# Patient Record
Sex: Female | Born: 1990 | Race: Black or African American | Hispanic: No | Marital: Single | Smoking: Never smoker
Health system: Southern US, Community
[De-identification: ages and names within clinical notes are randomized; demographics above are authoritative.]

## PROBLEM LIST (undated history)

## (undated) DIAGNOSIS — Z789 Other specified health status: Secondary | ICD-10-CM

## (undated) HISTORY — PX: NO PAST SURGERIES: SHX2092

## (undated) SURGERY — Surgical Case
Anesthesia: *Unknown

---
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
## 2015-10-31 ENCOUNTER — Other Ambulatory Visit (HOSPITAL_COMMUNITY): Payer: Self-pay | Admitting: Obstetrics and Gynecology

## 2015-10-31 DIAGNOSIS — IMO0002 Reserved for concepts with insufficient information to code with codable children: Secondary | ICD-10-CM

## 2015-10-31 DIAGNOSIS — Z3689 Encounter for other specified antenatal screening: Secondary | ICD-10-CM

## 2015-10-31 DIAGNOSIS — O351XX0 Maternal care for (suspected) chromosomal abnormality in fetus, not applicable or unspecified: Secondary | ICD-10-CM

## 2015-10-31 DIAGNOSIS — Z3A18 18 weeks gestation of pregnancy: Secondary | ICD-10-CM

## 2015-11-02 ENCOUNTER — Encounter (HOSPITAL_COMMUNITY): Payer: Self-pay | Admitting: Obstetrics and Gynecology

## 2015-11-10 ENCOUNTER — Encounter (HOSPITAL_COMMUNITY): Payer: Self-pay | Admitting: *Deleted

## 2015-11-11 ENCOUNTER — Ambulatory Visit (HOSPITAL_COMMUNITY)
Admission: RE | Admit: 2015-11-11 | Discharge: 2015-11-11 | Disposition: A | Discharge status: Home / Self Care | Payer: 59 | Source: Ambulatory Visit | Attending: Obstetrics and Gynecology | Admitting: Obstetrics and Gynecology

## 2015-11-11 ENCOUNTER — Encounter (HOSPITAL_COMMUNITY): Payer: Self-pay

## 2015-11-11 ENCOUNTER — Other Ambulatory Visit (HOSPITAL_COMMUNITY): Payer: Self-pay | Admitting: Obstetrics and Gynecology

## 2015-11-11 ENCOUNTER — Other Ambulatory Visit (HOSPITAL_COMMUNITY): Payer: Self-pay | Admitting: *Deleted

## 2015-11-11 DIAGNOSIS — O351XX0 Maternal care for (suspected) chromosomal abnormality in fetus, not applicable or unspecified: Secondary | ICD-10-CM

## 2015-11-11 DIAGNOSIS — IMO0002 Reserved for concepts with insufficient information to code with codable children: Secondary | ICD-10-CM

## 2015-11-11 DIAGNOSIS — Z3A15 15 weeks gestation of pregnancy: Secondary | ICD-10-CM | POA: Insufficient documentation

## 2015-11-11 DIAGNOSIS — O289 Unspecified abnormal findings on antenatal screening of mother: Secondary | ICD-10-CM

## 2015-11-11 DIAGNOSIS — Z3689 Encounter for other specified antenatal screening: Secondary | ICD-10-CM

## 2015-11-11 DIAGNOSIS — O280 Abnormal hematological finding on antenatal screening of mother: Secondary | ICD-10-CM | POA: Insufficient documentation

## 2015-11-11 DIAGNOSIS — Z363 Encounter for antenatal screening for malformations: Secondary | ICD-10-CM | POA: Insufficient documentation

## 2015-11-11 DIAGNOSIS — Z315 Encounter for genetic counseling: Secondary | ICD-10-CM | POA: Diagnosis present

## 2015-11-11 DIAGNOSIS — O283 Abnormal ultrasonic finding on antenatal screening of mother: Secondary | ICD-10-CM | POA: Insufficient documentation

## 2015-11-11 DIAGNOSIS — Z3A18 18 weeks gestation of pregnancy: Secondary | ICD-10-CM

## 2015-11-11 HISTORY — DX: Other specified health status: Z78.9

## 2015-11-11 NOTE — Progress Notes (Signed)
Genetic Counseling  High-Risk Gestation Note  Appointment Date:  11/11/2015 Referred By: Annette AlandAnderson, Mark E, MD Date of Birth:  12/22/1990   Pregnancy History: G1P0 Estimated Date of Delivery: 04/28/16 Estimated Gestational Age: [id number] Attending: Charlsie MerlesMark Newman, MD   [name] was seen for genetic counseling because of an increased risk for fetal Down syndrome based on first trimester screening. The patient's mother was also present for today's visit.   In summary:  Reviewed results of first trimester screening test  Increased risk for Down syndrome (1 in 263)  Discussed additional screening options  NIPS-declined today  Ultrasound-limited ultrasound performed today; see separate report  Follow-up ultrasound scheduled 12/23/15  Discussed diagnostic testing options  Amniocentesis-declined  Reviewed family history concerns  Discussed general population carrier screening options - patient declined  CF  SMA  Hemoglobinopathies  She was counseled regarding the screening result and the associated 1 in 263 risk for fetal Down syndrome.  We reviewed chromosomes, nondisjunction, and the common features and variable prognosis of Down syndrome.  In addition, we reviewed the screen adjusted reduction in risk for trisomy 18 (<1 in 5,000).  We also discussed other explanations for a screen positive result including: differences in maternal metabolism and normal variation.  We reviewed other available screening options including noninvasive prenatal screening (NIPS)/cell free DNA (cfDNA) screening and detailed ultrasound.  She was counseled that screening tests are used to modify a patient's a priori risk for aneuploidy, typically based on age. This estimate provides a pregnancy specific risk assessment. We reviewed the benefits and limitations of each option. Specifically, we discussed the conditions for which each test screens, the detection rates, and false positive rates of each. She  was also counseled regarding diagnostic testing via amniocentesis. We reviewed the approximate 1 in 300-500 risk for complications from amniocentesis, including spontaneous pregnancy loss. We discussed the possible results that the tests might provide including: positive, negative, unanticipated, and no result. Finally, they were counseled regarding the cost of each option and potential out of pocket expenses. After consideration of all the options, she declined NIPS and amniocentesis at this time, stating she was comfortable with the current risk assessment. [name] also indicated that she would not alter the pregnancy course in the case of the presence of Down syndrome in the pregnancy.   An ultrasound was performed today. The ultrasound report will be sent under separate cover. There were no visualized fetal anomalies or markers suggestive of aneuploidy; however, some views of fetal anatomy were limited due to early gestation. She understands that screening tests cannot rule out all birth defects or genetic syndromes. The patient was advised of this limitation and states she still does not want additional testing at this time. Follow-up ultrasound is scheduled for 12/23/15.   [name] was provided with written information regarding cystic fibrosis (CF), spinal muscular atrophy (SMA) and hemoglobinopathies including the carrier frequency, availability of carrier screening and prenatal diagnosis if indicated.  In addition, we discussed that CF and hemoglobinopathies are routinely screened for as part of the [place] newborn screening panel.  She declined screening for CF, SMA and hemoglobinopathies.   Both family histories were reviewed and found to be contributory for the patient's maternal great-uncle with spina bifida. His mother reportedly had mumps during her pregnancy with him. No additional relatives were reported with spina bifida or other birth defects. We discussed that spina bifida is a common birth  differences and affects approximately 1 in 500 live births.  They  were counseled that neural tube defects (NTDs) occurs as an isolated finding, in the majority of cases. Isolated NTDs are usually inherited in a multifactorial manner.  Multifactorial conditions have both environmental and genetic factors that contribute to their development.  Both the genetic and environmental factors that contribute to the development of spina bifida are largely unknown; however, some medications and health conditions, such as uncontrolled diabetes and obesity, may increase the chance of spina bifida.  We also discussed the role of folic acid in the development of the neural tube and prevention of spina bifida.  Approximately 5-10% of individuals who have spina bifida also have an underlying chromosome condition.  If this relative had a nonsyndromic, multifactorial NTD, the risk of recurrence would not be increased above the general population risk for the current pregnancy.  If however, the child had an underlying genetic syndrome, the risk of recurrence depends upon the inheritance of the condition.    Further genetic counseling is warranted if more information is obtained.  [name] was counseled regarding prenatal screening and diagnostic options for detection of NTDs.  We discussed that screening options adjust a patient's a priori risk and provide a pregnancy specific risk for the condition.  Specifically, we reviewed MSAFP in the second trimester and ultrasound.  We reviewed that detailed ultrasound detects between 80 and 90% of NTDs.  Without further information regarding the provided family history, an accurate genetic risk cannot be calculated. Further genetic counseling is warranted if more information is obtained.  [name] denied exposure to environmental toxins or chemical agents. She denied the use of alcohol, tobacco or street drugs. She denied significant viral illnesses during the course of  her pregnancy. Her medical and surgical histories were noncontributory.   I counseled [name] for approximately 40 minutes regarding the above risks and available options.   Quinn PlowmanKaren Jazlynn Nemetz, MS,  Certified Genetic Counselor 11/11/2015

## 2015-11-17 ENCOUNTER — Other Ambulatory Visit (HOSPITAL_COMMUNITY): Payer: Self-pay

## 2015-12-23 ENCOUNTER — Encounter (HOSPITAL_COMMUNITY): Payer: Self-pay

## 2015-12-23 ENCOUNTER — Ambulatory Visit (HOSPITAL_COMMUNITY)
Admission: RE | Admit: 2015-12-23 | Discharge: 2015-12-23 | Disposition: A | Discharge status: Home / Self Care | Payer: 59 | Source: Ambulatory Visit | Attending: Obstetrics and Gynecology | Admitting: Obstetrics and Gynecology

## 2015-12-23 ENCOUNTER — Other Ambulatory Visit (HOSPITAL_COMMUNITY): Payer: Self-pay | Admitting: Obstetrics and Gynecology

## 2015-12-23 DIAGNOSIS — O99212 Obesity complicating pregnancy, second trimester: Secondary | ICD-10-CM | POA: Diagnosis not present

## 2015-12-23 DIAGNOSIS — Z0489 Encounter for examination and observation for other specified reasons: Secondary | ICD-10-CM

## 2015-12-23 DIAGNOSIS — O289 Unspecified abnormal findings on antenatal screening of mother: Secondary | ICD-10-CM | POA: Diagnosis present

## 2015-12-23 DIAGNOSIS — IMO0002 Reserved for concepts with insufficient information to code with codable children: Secondary | ICD-10-CM

## 2015-12-23 DIAGNOSIS — Z3A21 21 weeks gestation of pregnancy: Secondary | ICD-10-CM | POA: Diagnosis not present

## 2015-12-23 DIAGNOSIS — O280 Abnormal hematological finding on antenatal screening of mother: Secondary | ICD-10-CM

## 2015-12-23 DIAGNOSIS — Z362 Encounter for other antenatal screening follow-up: Secondary | ICD-10-CM

## 2016-01-09 NOTE — L&D Delivery Note (Signed)
Delivery Note At 6:44 PM a viable and healthy female was delivered via Vaginal, Spontaneous Delivery (Presentation: right occiput anterior;  ).  APGAR: 8, 9; weight pending.   Placenta status: spontaneous, intact.  Cord: 3V with loose nuchal x 1, reduced on perineum    Anesthesia:  Epidural Episiotomy: None Lacerations: 1st degree Suture Repair: 3.0 vicryl Est. Blood Loss (mL): 300  Mom to postpartum.  Baby to Couplet care / Skin to Skin.  Flay Ghosh H. 04/21/2016, 7:16 PM

## 2016-03-08 ENCOUNTER — Other Ambulatory Visit: Payer: Self-pay | Admitting: Obstetrics & Gynecology

## 2016-03-08 DIAGNOSIS — R2231 Localized swelling, mass and lump, right upper limb: Secondary | ICD-10-CM

## 2016-04-21 ENCOUNTER — Encounter (HOSPITAL_COMMUNITY): Payer: Self-pay | Admitting: Certified Nurse Midwife

## 2016-04-21 ENCOUNTER — Inpatient Hospital Stay (HOSPITAL_COMMUNITY): Payer: 59 | Admitting: Anesthesiology

## 2016-04-21 ENCOUNTER — Encounter (HOSPITAL_COMMUNITY)
Admission: AD | Disposition: A | Discharge status: Home / Self Care | Payer: Self-pay | Source: Ambulatory Visit | Attending: Obstetrics and Gynecology

## 2016-04-21 ENCOUNTER — Inpatient Hospital Stay (HOSPITAL_COMMUNITY)
Admission: AD | Admit: 2016-04-21 | Discharge: 2016-04-23 | DRG: 775 | Disposition: A | Discharge status: Home / Self Care | Payer: 59 | Source: Ambulatory Visit | Attending: Obstetrics and Gynecology | Admitting: Obstetrics and Gynecology

## 2016-04-21 DIAGNOSIS — Z3493 Encounter for supervision of normal pregnancy, unspecified, third trimester: Secondary | ICD-10-CM | POA: Diagnosis present

## 2016-04-21 DIAGNOSIS — O99214 Obesity complicating childbirth: Secondary | ICD-10-CM | POA: Diagnosis present

## 2016-04-21 DIAGNOSIS — Z3A39 39 weeks gestation of pregnancy: Secondary | ICD-10-CM

## 2016-04-21 DIAGNOSIS — O99824 Streptococcus B carrier state complicating childbirth: Secondary | ICD-10-CM | POA: Diagnosis present

## 2016-04-21 DIAGNOSIS — O280 Abnormal hematological finding on antenatal screening of mother: Secondary | ICD-10-CM

## 2016-04-21 DIAGNOSIS — Z6841 Body Mass Index (BMI) 40.0 and over, adult: Secondary | ICD-10-CM | POA: Diagnosis not present

## 2016-04-21 LAB — TYPE AND SCREEN
ABO/RH(D): B POS
ANTIBODY SCREEN: NEGATIVE

## 2016-04-21 LAB — COMPREHENSIVE METABOLIC PANEL
ALBUMIN: 2.8 g/dL — AB (ref 3.5–5.0)
ALT: 13 U/L — ABNORMAL LOW (ref 14–54)
AST: 18 U/L (ref 15–41)
Alkaline Phosphatase: 102 U/L (ref 38–126)
Anion gap: 8 (ref 5–15)
BILIRUBIN TOTAL: 0.5 mg/dL (ref 0.3–1.2)
BUN: 10 mg/dL (ref 6–20)
CO2: 22 mmol/L (ref 22–32)
Calcium: 8.6 mg/dL — ABNORMAL LOW (ref 8.9–10.3)
Chloride: 103 mmol/L (ref 101–111)
Creatinine, Ser: 0.86 mg/dL (ref 0.44–1.00)
GFR calc Af Amer: >60 mL/min (ref >60)
GFR calc non Af Amer: >60 mL/min (ref >60)
GLUCOSE: 87 mg/dL (ref 65–99)
POTASSIUM: 4 mmol/L (ref 3.5–5.1)
SODIUM: 133 mmol/L — AB (ref 135–145)
TOTAL PROTEIN: 6.5 g/dL (ref 6.5–8.1)

## 2016-04-21 LAB — CBC
HEMATOCRIT: 28.2 % — AB (ref 36.0–46.0)
HEMOGLOBIN: 9.2 g/dL — AB (ref 12.0–15.0)
MCH: 24.4 pg — ABNORMAL LOW (ref 26.0–34.0)
MCHC: 32.6 g/dL (ref 30.0–36.0)
MCV: 74.8 fL — ABNORMAL LOW (ref 78.0–100.0)
Platelets: 319 10*3/uL (ref 150–400)
RBC: 3.77 MIL/uL — ABNORMAL LOW (ref 3.87–5.11)
RDW: 15.3 % (ref 11.5–15.5)
WBC: 13.9 10*3/uL — AB (ref 4.0–10.5)

## 2016-04-21 LAB — URINALYSIS, ROUTINE W REFLEX MICROSCOPIC
Bilirubin Urine: NEGATIVE
Glucose, UA: NEGATIVE mg/dL
KETONES UR: NEGATIVE mg/dL
Nitrite: NEGATIVE
PROTEIN: NEGATIVE mg/dL
Specific Gravity, Urine: 1.008 (ref 1.005–1.030)
pH: 6 (ref 5.0–8.0)

## 2016-04-21 LAB — ABO/RH: ABO/RH(D): B POS

## 2016-04-21 LAB — URIC ACID: Uric Acid, Serum: 5.7 mg/dL (ref 2.3–6.6)

## 2016-04-21 LAB — PROTEIN / CREATININE RATIO, URINE
CREATININE, URINE: 75 mg/dL
PROTEIN CREATININE RATIO: 0.13 mg/mg{creat} (ref 0.00–0.15)
TOTAL PROTEIN, URINE: 10 mg/dL

## 2016-04-21 LAB — RPR: RPR: NONREACTIVE

## 2016-04-21 LAB — LACTATE DEHYDROGENASE: LDH: 139 U/L (ref 98–192)

## 2016-04-21 SURGERY — LIGATION, FALLOPIAN TUBE, POSTPARTUM
Anesthesia: Choice | Laterality: Bilateral

## 2016-04-21 MED ORDER — PHENYLEPHRINE 40 MCG/ML (10ML) SYRINGE FOR IV PUSH (FOR BLOOD PRESSURE SUPPORT)
80.0000 ug | PREFILLED_SYRINGE | INTRAVENOUS | Status: DC | PRN
Start: 2016-04-21 — End: 2016-04-21
  Filled 2016-04-21: qty 5

## 2016-04-21 MED ORDER — CEFAZOLIN IN D5W 1 GM/50ML IV SOLN
1.0000 g | Freq: Three times a day (TID) | INTRAVENOUS | Status: DC
Start: 2016-04-21 — End: 2016-04-21
  Administered 2016-04-21: 1 g via INTRAVENOUS
  Filled 2016-04-21 (×2): qty 50

## 2016-04-21 MED ORDER — DIPHENHYDRAMINE HCL 50 MG/ML IJ SOLN: 12.5000 mg | INTRAMUSCULAR | Status: DC | PRN

## 2016-04-21 MED ORDER — EPHEDRINE 5 MG/ML INJ
10.0000 mg | INTRAVENOUS | Status: DC | PRN
  Filled 2016-04-21: qty 2

## 2016-04-21 MED ORDER — FENTANYL 2.5 MCG/ML BUPIVACAINE 1/10 % EPIDURAL INFUSION (WH - ANES)
14.0000 mL/h | INTRAMUSCULAR | Status: DC | PRN
  Administered 2016-04-21 (×2): 14 mL/h via EPIDURAL
  Filled 2016-04-21 (×2): qty 100

## 2016-04-21 MED ORDER — PHENYLEPHRINE 40 MCG/ML (10ML) SYRINGE FOR IV PUSH (FOR BLOOD PRESSURE SUPPORT)
80.0000 ug | PREFILLED_SYRINGE | INTRAVENOUS | Status: DC | PRN
  Filled 2016-04-21: qty 5
  Filled 2016-04-21: qty 10

## 2016-04-21 MED ORDER — OXYCODONE-ACETAMINOPHEN 5-325 MG PO TABS: 2.0000 | ORAL_TABLET | ORAL | Status: DC | PRN

## 2016-04-21 MED ORDER — ACETAMINOPHEN 325 MG PO TABS: 650.0000 mg | ORAL_TABLET | ORAL | Status: DC | PRN

## 2016-04-21 MED ORDER — COCONUT OIL OIL: 1.0000 "application " | TOPICAL_OIL | Status: DC | PRN

## 2016-04-21 MED ORDER — EPHEDRINE 5 MG/ML INJ
10.0000 mg | INTRAVENOUS | Status: DC | PRN
Start: 2016-04-21 — End: 2016-04-21
  Filled 2016-04-21: qty 2

## 2016-04-21 MED ORDER — LACTATED RINGERS IV SOLN
500.0000 mL | INTRAVENOUS | Status: DC | PRN
  Administered 2016-04-21: 500 mL via INTRAVENOUS

## 2016-04-21 MED ORDER — PRENATAL MULTIVITAMIN CH
1.0000 | ORAL_TABLET | Freq: Every day | ORAL | Status: DC
  Administered 2016-04-22: 1 via ORAL
  Filled 2016-04-21: qty 1

## 2016-04-21 MED ORDER — CEFAZOLIN SODIUM-DEXTROSE 2-4 GM/100ML-% IV SOLN
2.0000 g | Freq: Once | INTRAVENOUS | Status: AC
  Administered 2016-04-21: 2 g via INTRAVENOUS

## 2016-04-21 MED ORDER — SENNOSIDES-DOCUSATE SODIUM 8.6-50 MG PO TABS
2.0000 | ORAL_TABLET | ORAL | Status: DC
  Administered 2016-04-21 – 2016-04-23 (×2): 2 via ORAL
  Filled 2016-04-21 (×2): qty 2

## 2016-04-21 MED ORDER — OXYTOCIN 40 UNITS IN LACTATED RINGERS INFUSION - SIMPLE MED
1.0000 m[IU]/min | INTRAVENOUS | Status: DC
  Administered 2016-04-21: 2 m[IU]/min via INTRAVENOUS
  Filled 2016-04-21: qty 1000

## 2016-04-21 MED ORDER — WITCH HAZEL-GLYCERIN EX PADS: 1.0000 "application " | MEDICATED_PAD | CUTANEOUS | Status: DC | PRN

## 2016-04-21 MED ORDER — ONDANSETRON HCL 4 MG PO TABS: 4.0000 mg | ORAL_TABLET | ORAL | Status: DC | PRN

## 2016-04-21 MED ORDER — LABETALOL HCL 5 MG/ML IV SOLN: 20.0000 mg | INTRAVENOUS | Status: DC | PRN

## 2016-04-21 MED ORDER — LIDOCAINE HCL (PF) 1 % IJ SOLN
30.0000 mL | INTRAMUSCULAR | Status: DC | PRN
  Filled 2016-04-21: qty 30

## 2016-04-21 MED ORDER — BUTORPHANOL TARTRATE 1 MG/ML IJ SOLN
1.0000 mg | INTRAMUSCULAR | Status: DC | PRN
  Filled 2016-04-21: qty 1

## 2016-04-21 MED ORDER — OXYTOCIN BOLUS FROM INFUSION
500.0000 mL | Freq: Once | INTRAVENOUS | Status: AC
  Administered 2016-04-21: 500 mL via INTRAVENOUS

## 2016-04-21 MED ORDER — OXYCODONE-ACETAMINOPHEN 5-325 MG PO TABS: 1.0000 | ORAL_TABLET | ORAL | Status: DC | PRN

## 2016-04-21 MED ORDER — OXYTOCIN 40 UNITS IN LACTATED RINGERS INFUSION - SIMPLE MED: 2.5000 [IU]/h | INTRAVENOUS | Status: DC

## 2016-04-21 MED ORDER — ONDANSETRON HCL 4 MG/2ML IJ SOLN
4.0000 mg | Freq: Four times a day (QID) | INTRAMUSCULAR | Status: DC | PRN
  Administered 2016-04-21: 4 mg via INTRAVENOUS
  Filled 2016-04-21: qty 2

## 2016-04-21 MED ORDER — IBUPROFEN 600 MG PO TABS
600.0000 mg | ORAL_TABLET | Freq: Four times a day (QID) | ORAL | Status: DC
  Administered 2016-04-21 – 2016-04-23 (×6): 600 mg via ORAL
  Filled 2016-04-21 (×6): qty 1

## 2016-04-21 MED ORDER — DIBUCAINE 1 % RE OINT: 1.0000 "application " | TOPICAL_OINTMENT | RECTAL | Status: DC | PRN

## 2016-04-21 MED ORDER — DIPHENHYDRAMINE HCL 25 MG PO CAPS
25.0000 mg | ORAL_CAPSULE | Freq: Four times a day (QID) | ORAL | Status: DC | PRN
Start: 2016-04-21 — End: 2016-04-23

## 2016-04-21 MED ORDER — HYDRALAZINE HCL 20 MG/ML IJ SOLN: 10.0000 mg | Freq: Once | INTRAMUSCULAR | Status: DC | PRN

## 2016-04-21 MED ORDER — FLEET ENEMA 7-19 GM/118ML RE ENEM: 1.0000 | ENEMA | RECTAL | Status: DC | PRN

## 2016-04-21 MED ORDER — BENZOCAINE-MENTHOL 20-0.5 % EX AERO
1.0000 "application " | INHALATION_SPRAY | CUTANEOUS | Status: DC | PRN
  Administered 2016-04-22: 1 via TOPICAL
  Filled 2016-04-21: qty 56

## 2016-04-21 MED ORDER — SIMETHICONE 80 MG PO CHEW: 80.0000 mg | CHEWABLE_TABLET | ORAL | Status: DC | PRN

## 2016-04-21 MED ORDER — SOD CITRATE-CITRIC ACID 500-334 MG/5ML PO SOLN: 30.0000 mL | ORAL | Status: DC | PRN

## 2016-04-21 MED ORDER — TERBUTALINE SULFATE 1 MG/ML IJ SOLN
0.2500 mg | Freq: Once | INTRAMUSCULAR | Status: DC | PRN
Start: 2016-04-21 — End: 2016-04-21
  Filled 2016-04-21: qty 1

## 2016-04-21 MED ORDER — SODIUM BICARBONATE 8.4 % IV SOLN
INTRAVENOUS | Status: DC | PRN
  Administered 2016-04-21: 4 mL via EPIDURAL

## 2016-04-21 MED ORDER — LACTATED RINGERS IV SOLN
INTRAVENOUS | Status: DC
  Administered 2016-04-21 (×2): via INTRAVENOUS

## 2016-04-21 MED ORDER — ONDANSETRON HCL 4 MG/2ML IJ SOLN: 4.0000 mg | INTRAMUSCULAR | Status: DC | PRN

## 2016-04-21 MED ORDER — LACTATED RINGERS IV SOLN
500.0000 mL | Freq: Once | INTRAVENOUS | Status: AC
  Administered 2016-04-21: 500 mL via INTRAVENOUS

## 2016-04-21 MED ORDER — ZOLPIDEM TARTRATE 5 MG PO TABS: 5.0000 mg | ORAL_TABLET | Freq: Every evening | ORAL | Status: DC | PRN

## 2016-04-21 MED ORDER — TETANUS-DIPHTH-ACELL PERTUSSIS 5-2.5-18.5 LF-MCG/0.5 IM SUSP: 0.5000 mL | Freq: Once | INTRAMUSCULAR | Status: DC

## 2016-04-21 NOTE — Anesthesia Preprocedure Evaluation (Signed)
Anesthesia Evaluation  Patient identified by MRN, date of birth, ID band Patient awake    Airway Mallampati: II  TM Distance: >3 FB Neck ROM: Full    Dental no notable dental hx.    Pulmonary neg pulmonary ROS,    Pulmonary exam normal        Cardiovascular negative cardio ROS Normal cardiovascular exam Rhythm:Regular     Neuro/Psych negative neurological ROS  negative psych ROS   GI/Hepatic negative GI ROS, Neg liver ROS,   Endo/Other  Morbid obesity  Renal/GU negative Renal ROS     Musculoskeletal negative musculoskeletal ROS (+)   Abdominal   Peds  Hematology   Anesthesia Other Findings   Reproductive/Obstetrics negative OB ROS                             Anesthesia Physical Anesthesia Plan  ASA: II  Anesthesia Plan: Epidural   Post-op Pain Management:    Induction:   Airway Management Planned:   Additional Equipment:   Intra-op Plan:   Post-operative Plan:   Informed Consent: I have reviewed the patients History and Physical, chart, labs and discussed the procedure including the risks, benefits and alternatives for the proposed anesthesia with the patient or authorized representative who has indicated his/her understanding and acceptance.     Plan Discussed with:   Anesthesia Plan Comments:         Anesthesia Quick Evaluation

## 2016-04-21 NOTE — Anesthesia Procedure Notes (Signed)
Epidural Patient location during procedure: OB Start time: 04/21/2016 8:40 AM  Staffing Anesthesiologist: Odette Fraction Performed: anesthesiologist   Preanesthetic Checklist Completed: patient identified, site marked, surgical consent, pre-op evaluation, timeout performed, IV checked, risks and benefits discussed and monitors and equipment checked  Epidural Patient position: sitting Prep: DuraPrep Patient monitoring: heart rate, continuous pulse ox and blood pressure Approach: midline Location: L3-L4 Injection technique: LOR air and LOR saline  Needle:  Needle type: Tuohy  Needle gauge: 18 G Needle length: 15 cm Needle insertion depth: 12 cm Catheter type: closed end flexible Catheter size: 20 Guage Catheter at skin depth: 15 cm Test dose: 2% lidocaine with Epi 1:200 K  Additional Notes I was present and participated in the regional anesthetic procedure key events.  By signing, I attest that I have identified and re-evaluated the patient immediately before the induction of anesthesia and I am satisfied that my anesthetic plan is suitable for the patient's condition and procedure.Reason for block:procedure for pain

## 2016-04-21 NOTE — H&P (Signed)
Annette Patel is a 26 y.o. female presenting for labor  26 yo G1P0 @ 39+0 presents for painful contractions and was found to be in labor. Additionally, her BPs were noted to be elevated. PIH labs were all normal. When patient's BP cuff was changed to a larger size her BPs improved OB History    Gravida Para Term Preterm AB Living   1         0   SAB TAB Ectopic Multiple Live Births                 Past Medical History:  Diagnosis Date  . Medical history non-contributory    Past Surgical History:  Procedure Laterality Date  . NO PAST SURGERIES     Family History: family history is not on file. Social History:  reports that she has never smoked. She has never used smokeless tobacco. She reports that she does not drink alcohol or use drugs.     Maternal Diabetes: No Genetic Screening: Abnormal:  Results: Elevated risk of Trisomy 21 S/P MFM consult & level 2 [place]. Declined NIPT/amnio Maternal Ultrasounds/Referrals: Normal Fetal Ultrasounds or other Referrals:  Referred to Materal Fetal Medicine  Maternal Substance Abuse:  No Significant Maternal Medications:  None Significant Maternal Lab Results:  None Other Comments:  None  ROS History Dilation: 6 Effacement (%): 70, 80 Station: -2 Exam by:: Eliberto Ivory RN Blood pressure 101/69, pulse 93, temperature 98.7 F (37.1 C), temperature source Oral, resp. rate 18, height  (1.702 m), weight (!) 138.8 kg (306 lb), last menstrual period 07/23/2015. Exam Physical Exam  Prenatal labs: ABO, Rh: --/--/B POS, B POS (04/14 1610) Antibody: NEG (04/14 0412) Rubella:  Imm RPR:   NR HBsAg:   Neg HIV:   NR GBS:   Positive  Assessment/Plan: 1) Admit 2) Ancef for GBS 3) Epidural on request 4) Labetalol IV prn severe range BPs   Emre Stock H. 04/21/2016, 10:26 AM

## 2016-04-21 NOTE — MAU Note (Signed)
Contractions since 2030. Getting closer and stronger. Denies LOF or bleeding. 2-3cm last sve

## 2016-04-21 NOTE — Anesthesia Pain Management Evaluation Note (Signed)
  CRNA Pain Management Visit Note  Patient: Annette Patel, 26 y.o., female  "Hello I am a member of the anesthesia team at [facility]. We have an anesthesia team available at all times to provide care throughout the hospital, including epidural management and anesthesia for C-section. I don't know your plan for the delivery whether it a natural birth, water birth, IV sedation, nitrous supplementation, doula or epidural, but we want to meet your pain goals."   1.Was your pain managed to your expectations on prior hospitalizations?   No prior hospitalizations  2.What is your expectation for pain management during this hospitalization?     Epidural and IV pain meds  3.How can we help you reach that goal? IV pain meds, epidural when ready.  Record the patient's initial score and the patient's pain goal.   Pain: 7--patient states that her L&D RN is aware and will administer pain medications.  Pain Goal: 7 [facility] wants you to be able to say your pain was always managed very well.  Lorrin Nawrot L 04/21/2016

## 2016-04-22 ENCOUNTER — Encounter (HOSPITAL_COMMUNITY): Payer: Self-pay | Admitting: *Deleted

## 2016-04-22 LAB — CBC
HEMATOCRIT: 23.5 % — AB (ref 36.0–46.0)
HEMOGLOBIN: 7.5 g/dL — AB (ref 12.0–15.0)
MCH: 24.4 pg — ABNORMAL LOW (ref 26.0–34.0)
MCHC: 31.9 g/dL (ref 30.0–36.0)
MCV: 76.3 fL — AB (ref 78.0–100.0)
Platelets: 274 10*3/uL (ref 150–400)
RBC: 3.08 MIL/uL — ABNORMAL LOW (ref 3.87–5.11)
RDW: 15.3 % (ref 11.5–15.5)
WBC: 16.6 10*3/uL — AB (ref 4.0–10.5)

## 2016-04-22 NOTE — Lactation Note (Signed)
This note was copied from a baby's chart. Lactation Consultation Note  Patient Name: Annette Patel [id number] Date: 04/22/2016 Reason for consult: Initial assessment   With this first time mom and term baby, now 17 hours old. Mom is feeding breast first, and then offering formula by bottle, as her choice. I reviewed with mom how using formula can decrease her milk supply, as well as basic lactation and breastfeeding teaching. Mom knows to call for questions/conerns   Maternal Data Formula Feeding for Exclusion: Yes Reason for exclusion: Mother's choice to formula and breast feed on admission Has patient been taught Hand Expression?: Yes Does the patient have breastfeeding experience prior to this delivery?: No  Feeding Feeding Type: Breast Fed Nipple Type: Slow - flow Length of feed: 10 min  LATCH Score/Interventions Latch: Grasps breast easily, tongue down, lips flanged, rhythmical sucking. Intervention(s): Adjust position;Assist with latch  Audible Swallowing: Spontaneous and intermittent Intervention(s): Skin to skin  Type of Nipple: Everted at rest and after stimulation  Comfort (Breast/Nipple): Soft / non-tender     Hold (Positioning): Assistance needed to correctly position infant at breast and maintain latch.  LATCH Score: 9  Lactation Tools Discussed/Used     Consult Status Consult Status: Follow-up Date: 04/23/16 Follow-up type: In-patient    Alfred Levins 04/22/2016, 2:11 PM

## 2016-04-22 NOTE — Progress Notes (Signed)
Post Partum Day 1 Subjective: no complaints, up ad lib, voiding and tolerating PO  Objective: Blood pressure (!) 123/59, pulse 76, temperature 98.4 F (36.9 C), temperature source Oral, resp. rate 18, height  (1.702 m), weight (!) 138.8 kg (306 lb), last menstrual period 07/23/2015, SpO2 99 %, unknown if currently breastfeeding.  Physical Exam:  General: alert, cooperative and appears stated age Lochia: appropriate Uterine Fundus: firm Incision: healing well DVT Evaluation: No evidence of DVT seen on physical exam.   Recent Labs  04/21/16 0146 04/22/16 0528  HGB 9.2* 7.5*  HCT 28.2* 23.5*    Assessment/Plan: Plan for discharge tomorrow  Breastfeeding   LOS: 1 day   Dayten Juba H. 04/22/2016, 9:50 AM

## 2016-04-22 NOTE — Anesthesia Postprocedure Evaluation (Signed)
Anesthesia Post Note  Patient: Annette Patel  Procedure(s) Performed: * No procedures listed *  Patient location during evaluation: Mother Baby Anesthesia Type: Epidural Level of consciousness: awake and alert Postop Assessment: no headache Anesthetic complications: no        Last Vitals:  Vitals:   04/21/16 2234 04/22/16 0300  BP: 134/70 (!) 123/59  Pulse: (!) 112 76  Resp: 17 18  Temp: 36.9 C 36.9 C    Last Pain:  Vitals:   04/22/16 0300  TempSrc: Oral  PainSc: 0-No pain   Pain Goal: Patients Stated Pain Goal: 9 (04/21/16 0336)               Gwynne Edinger

## 2016-04-23 MED ORDER — IBUPROFEN 600 MG PO TABS: 600.0000 mg | ORAL_TABLET | Freq: Four times a day (QID) | ORAL | 0 refills | Status: AC

## 2016-04-23 NOTE — Lactation Note (Signed)
This note was copied from a baby's chart. Lactation Consultation Note: Mother was breastfeeding infant when I arrived to room. Infant was latched on the left breast in cross cradle hold. Observed wide open gape with lips well flanged.  Mother advised to continue to cue base feed infant. Reviewed treatment plan to prevent engorgement . Reviewed S/S of Mastitis.  Mother advised to follow up with Lactation services. Mother informed of breastfeed support groups and phone hot line for continued help with breastfeeding. Mother receptive to all teaching.   Patient Name: Annette Patel [id number] Date: 04/23/2016 Reason for consult: Follow-up assessment   Maternal Data    Feeding Feeding Type: Breast Fed  LATCH Score/Interventions Latch: Grasps breast easily, tongue down, lips flanged, rhythmical sucking.  Audible Swallowing: A few with stimulation  Type of Nipple: Everted at rest and after stimulation  Comfort (Breast/Nipple): Soft / non-tender     Hold (Positioning): No assistance needed to correctly position infant at breast. Intervention(s): Support Pillows;Position options  LATCH Score: 9  Lactation Tools Discussed/Used     Consult Status Consult Status: Complete    Michel Bickers 04/23/2016, 10:06 AM

## 2016-04-23 NOTE — Progress Notes (Signed)
PPD#2 Pt doing well. Lochia wnl. VSSAF IMP/ Stable Plan/Will discharge

## 2016-04-23 NOTE — Discharge Summary (Signed)
Obstetric Discharge Summary Reason for Admission: onset of labor Prenatal Procedures: ultrasound Intrapartum Procedures: spontaneous vaginal delivery Postpartum Procedures: none Complications-Operative and Postpartum: 1st degree perineal laceration Hemoglobin  Date Value Ref Range Status  04/22/2016 7.5 (L) 12.0 - 15.0 g/dL Final   HCT  Date Value Ref Range Status  04/22/2016 23.5 (L) 36.0 - 46.0 % Final    Physical Exam:  General: alert and cooperative Lochia: appropriate Uterine Fundus: firm   Discharge Diagnoses: Term Pregnancy-delivered  Discharge Information: Date: 04/23/2016 Activity: pelvic rest Diet: routine Medications: PNV Condition: stable Instructions: refer to practice specific booklet Discharge to: home Follow-up Information    Almon Hercules., MD. Schedule an appointment as soon as possible for a visit in 1 month(s).   Specialty:  Obstetrics and Gynecology Contact information: [address] [place] [address] [phone]           Newborn Data: Live born female  Birth Weight: 7 lb 7.8 oz (3396 g) APGAR: 8, 9  Home with mother.  Annette Patel E 04/23/2016, 8:58 AM

## 2016-07-26 ENCOUNTER — Ambulatory Visit
Admission: RE | Admit: 2016-07-26 | Discharge: 2016-07-26 | Disposition: A | Discharge status: Home / Self Care | Payer: 59 | Source: Ambulatory Visit | Attending: Obstetrics & Gynecology | Admitting: Obstetrics & Gynecology

## 2016-07-26 ENCOUNTER — Other Ambulatory Visit: Payer: Self-pay | Admitting: Obstetrics & Gynecology

## 2016-07-26 DIAGNOSIS — R2231 Localized swelling, mass and lump, right upper limb: Secondary | ICD-10-CM

## 2016-07-26 DIAGNOSIS — N6310 Unspecified lump in the right breast, unspecified quadrant: Secondary | ICD-10-CM

## 2018-06-09 IMAGING — US US MFM OB DETAIL+14 WK
1 series · 14 of 28 positions shown · non-contrast
Comparison: none

[Series 1: us mfm ob detail+14 wk · 54 acquisitions, 14 frames shown]
[im 2/54]
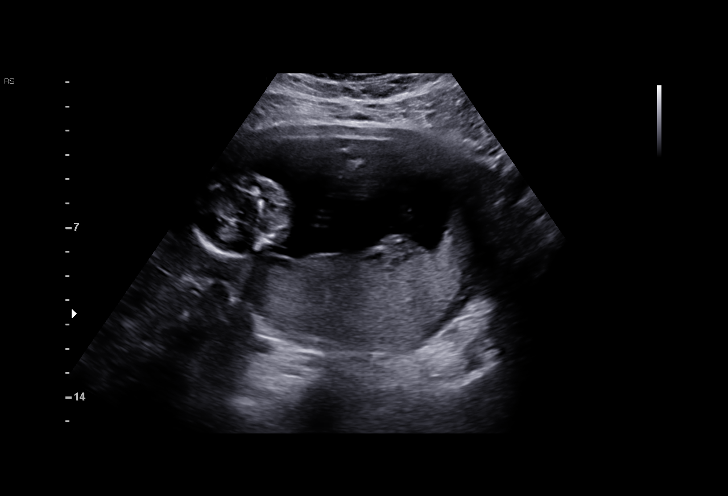
[im 6/54]
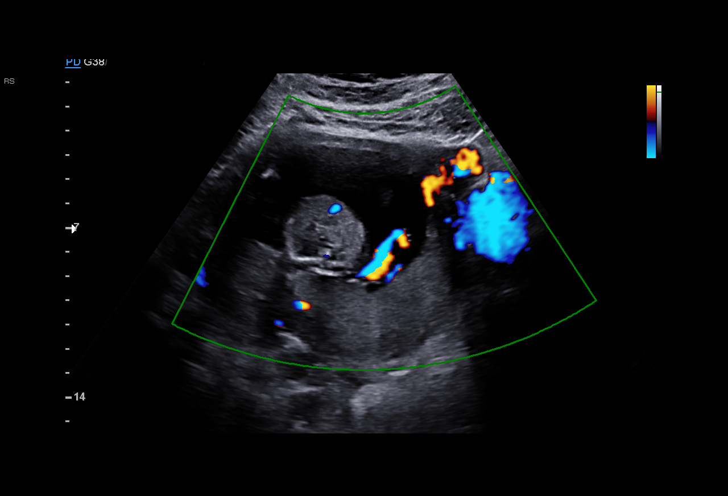
[im 10/54]
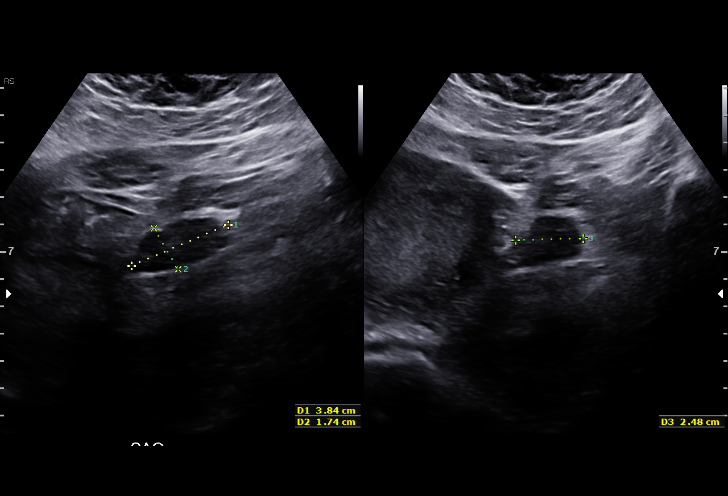
[im 14/54]
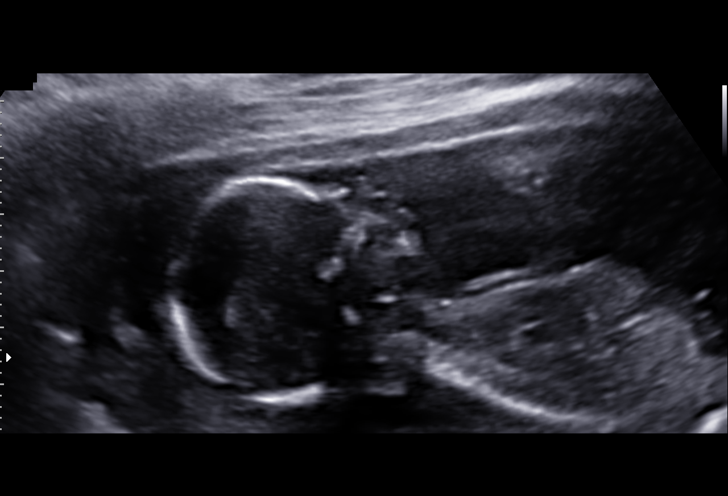
[im 18/54]
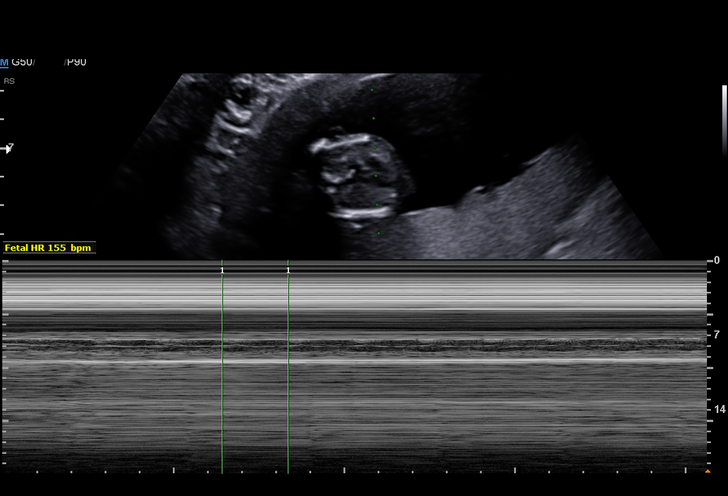
[im 22/54]
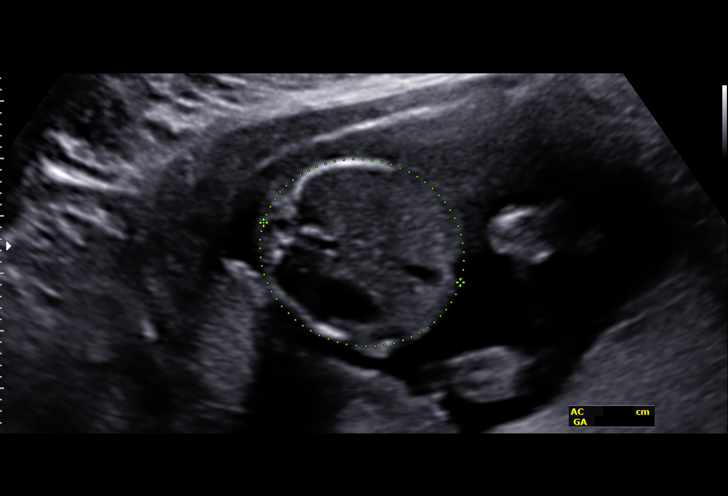
[im 26/54]
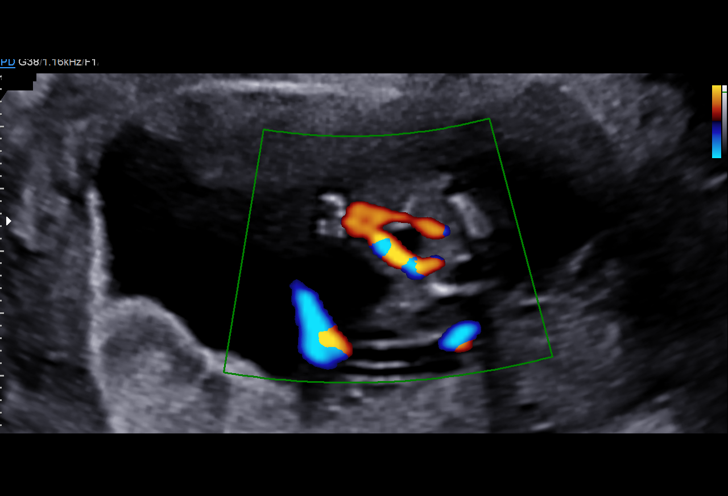
[im 30/54]
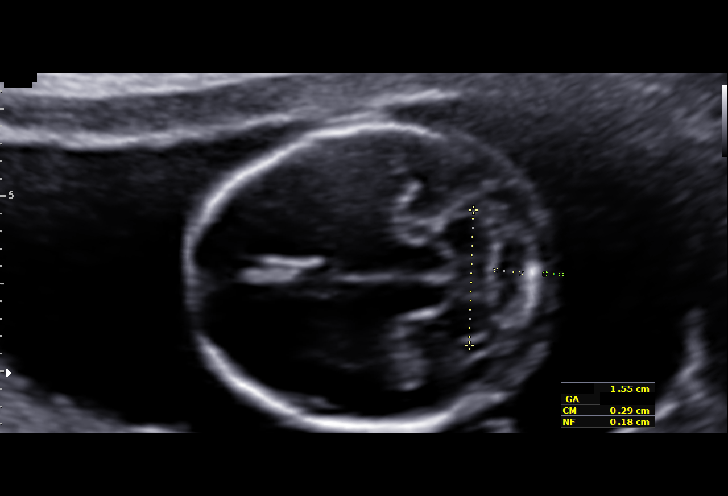
[im 34/54]
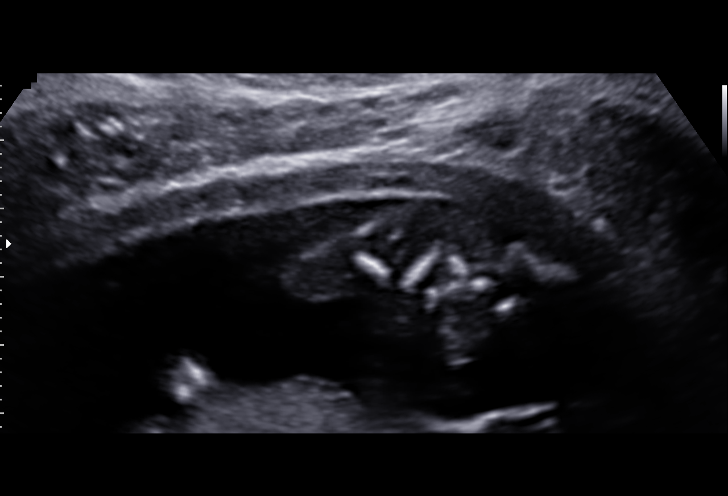
[im 38/54]
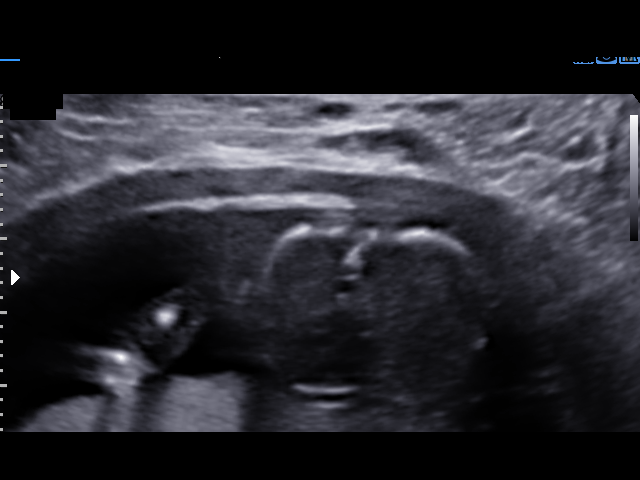
[im 42/54]
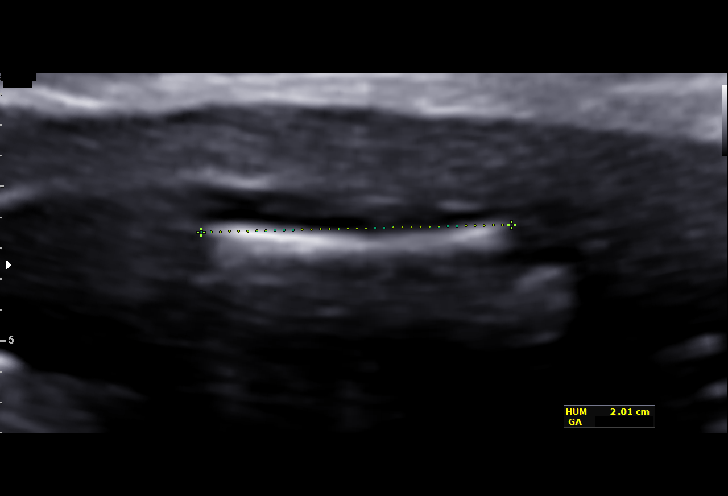
[im 46/54]
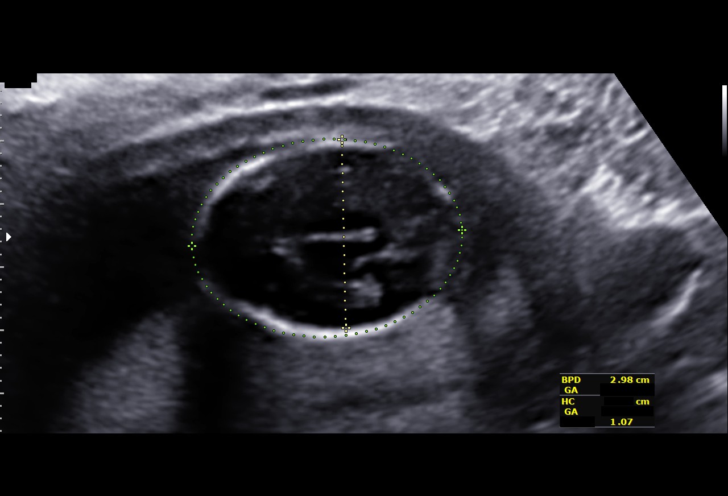
[im 50/54]
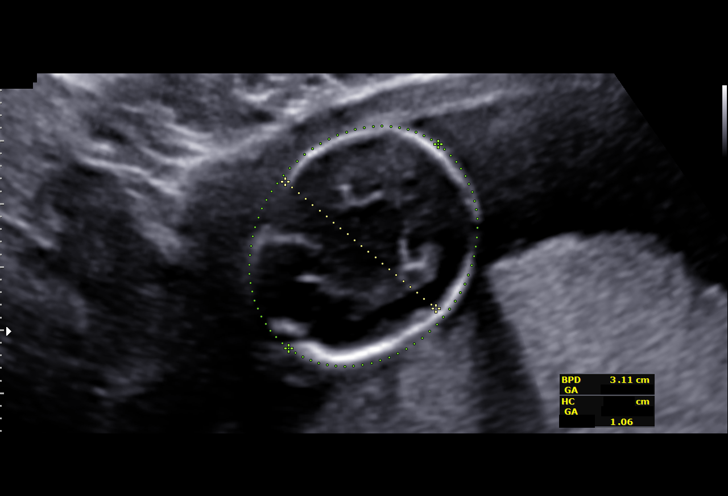
[im 54/54]
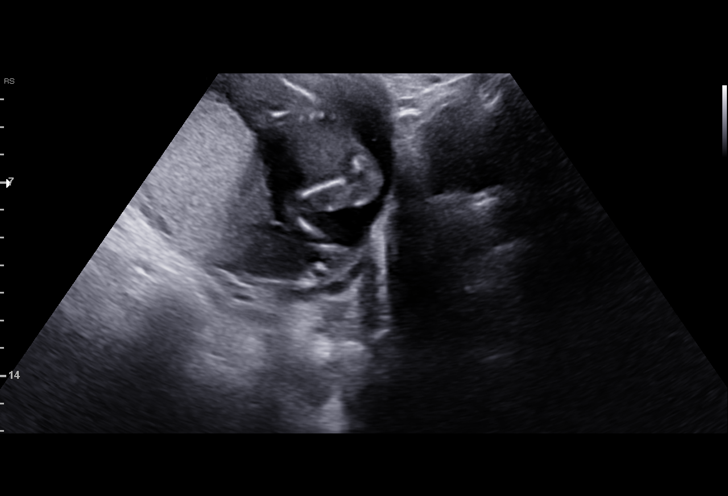

[14 of 28 positions shown; findings below may reference images not displayed]

Road; [HOSPITAL]

1  JEANLOURDY DURSO            [id number]       [id number]     [id number]
Indications

15 weeks gestation of pregnancy
Encounter for antenatal screening for
malformations
Abnormal biochemical screen
OB History

Gravidity:    1
Fetal Evaluation

Num Of Fetuses:     1
Fetal Heart         155
Rate(bpm):
Cardiac Activity:   Observed
Presentation:       Breech
Placenta:           Anterior
P. Cord Insertion:  Not well visualized

Amniotic Fluid
AFI FV:      Subjectively within normal limits

Largest Pocket(cm)
5.41
Biometry

BPD:      29.9  mm     G. Age:  15w 3d         27  %    CI:        62.79   %    70 - 86
FL/HC:      16.8   %    13.3 -
HC:      121.7  mm     G. Age:  16w 0d         47  %    HC/AC:      1.12        1.05 -
AC:      108.6  mm     G. Age:  16w 5d         79  %    FL/BPD:     68.2   %
FL:       20.4  mm     G. Age:  16w 1d         54  %    FL/AC:      18.8   %    20 - 24
HUM:      20.5  mm     G. Age:  16w 1d         60  %
CER:      15.5  mm     G. Age:  15w 4d         42  %
NFT:       1.8  mm
CM:        2.9  mm

Est. FW:     154  gm      0 lb 5 oz   > 75  %
Gestational Age

LMP:           15w 6d        Date:  07/23/15                 EDD:   04/28/16
U/S Today:     16w 1d                                        EDD:   04/26/16
Best:          15w 6d     Det. By:  LMP  (07/23/15)          EDD:   04/28/16
Anatomy

Cranium:               Appears normal         Aortic Arch:            Not well visualized
Cavum:                 Not well visualized    Ductal Arch:            Not well visualized
Ventricles:            Not well visualized    Diaphragm:              Not well visualized
Choroid Plexus:        Appears normal         Stomach:                Appears normal, left
sided
Cerebellum:            Appears normal         Abdomen:                Appears normal
Posterior Fossa:       Appears normal         Abdominal Wall:         Appears nml (cord
insert, abd wall)
Nuchal Fold:           Appears normal         Cord Vessels:           Appears normal (3
vessel cord)
Face:                  Orbits appear          Kidneys:                Appear normal
normal
Lips:                  Not well visualized    Bladder:                Appears normal
Thoracic:              Appears normal         Spine:                  Not well visualized
Heart:                 Not well visualized    Upper Extremities:      Visualized
RVOT:                  Not well visualized    Lower Extremities:      Visualized
LVOT:                  Not well visualized

Other:  Nasal bone visualized. Open hands visualized. Technically difficult
due to early gestational age.
Cervix Uterus Adnexa

Uterus
No abnormality visualized.

Left Ovary
Not visualized.

Right Ovary
Not visualized.

Adnexa:       No abnormality visualized. No adnexal mass
visualized.
Impression

Singleton intrauterine pregnancy at 15+6 weeks with
abnormal first trimester screen (1 in 263 for Trisomy 21, NT
normal range)
Review of the anatomy shows no sonographic markers for
aneuploidy or structural anomalies
However, cardiac and intracranial evaluations should be
considered suboptimal secondary to early gestational age.
Amniotic fluid volume is normal
Estimated fetal weight is 154g which is growth in the 75th
percentile
Recommendations

Genetic counseling was performed, and after that it was
decided to forgo [facility] consult. She has elected not to pursue
any further testing. I have asked her to return in 6 weeks to
complete the anatomic survey. If at that time there is normal
growth and development, then no further visits will be
necessary in MFM

## 2020-01-18 ENCOUNTER — Ambulatory Visit: Payer: 59

## 2022-01-10 ENCOUNTER — Telehealth: Payer: Self-pay

## 2022-01-10 NOTE — Telephone Encounter (Signed)
LVM. AS, CMA 

## 2022-05-15 DIAGNOSIS — L732 Hidradenitis suppurativa: Secondary | ICD-10-CM | POA: Diagnosis not present

## 2022-05-22 ENCOUNTER — Telehealth: Payer: Self-pay

## 2022-05-22 NOTE — Telephone Encounter (Signed)
LVM for patient to call back. AS, CMA 

## 2022-07-08 DIAGNOSIS — A6009 Herpesviral infection of other urogenital tract: Secondary | ICD-10-CM | POA: Diagnosis not present
